# Patient Record
Sex: Male | Born: 1965 | Race: White | Hispanic: No | Marital: Single | Smoking: Never smoker
Health system: Southern US, Community
[De-identification: ages and names within clinical notes are randomized; demographics above are authoritative.]

## PROBLEM LIST (undated history)

## (undated) HISTORY — PX: APPENDECTOMY: SHX54

---

## 2010-04-29 ENCOUNTER — Encounter: Admission: RE | Admit: 2010-04-29 | Discharge: 2010-04-29 | Payer: Self-pay | Admitting: Internal Medicine

## 2011-11-14 ENCOUNTER — Ambulatory Visit: Payer: Self-pay

## 2012-03-06 IMAGING — US US ABDOMEN COMPLETE
1 series · 14 of 25 positions shown · non-contrast
Comparison: None.

CLINICAL DATA: Elevated liver function tests, hypertension

COMPLETE ABDOMINAL ULTRASOUND

[Series 1: us abdomen complete · 0.28mm/px · 14 of 59 slices shown]
[im 1/59]
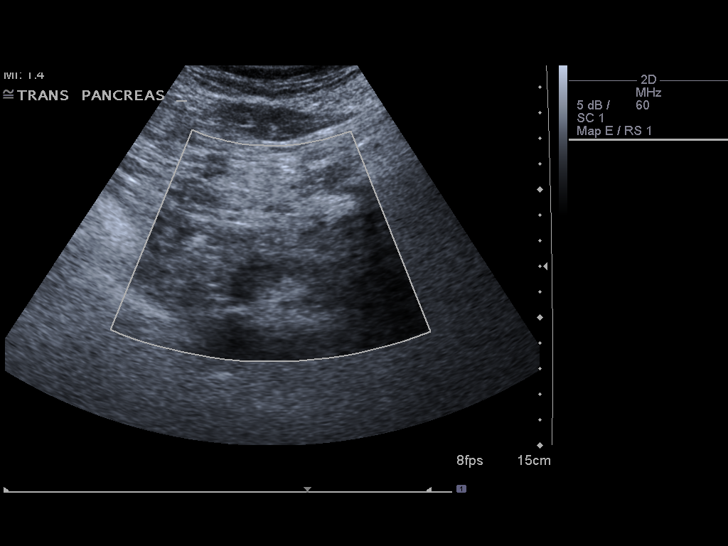
[im 5/59]
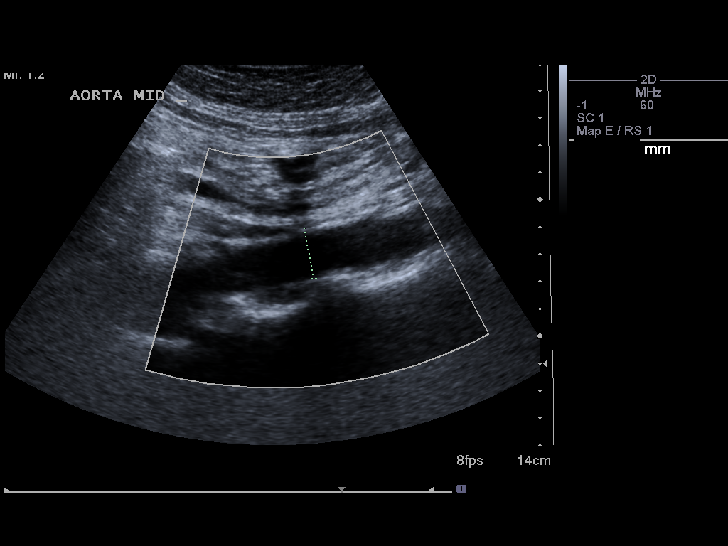
[im 10/59]
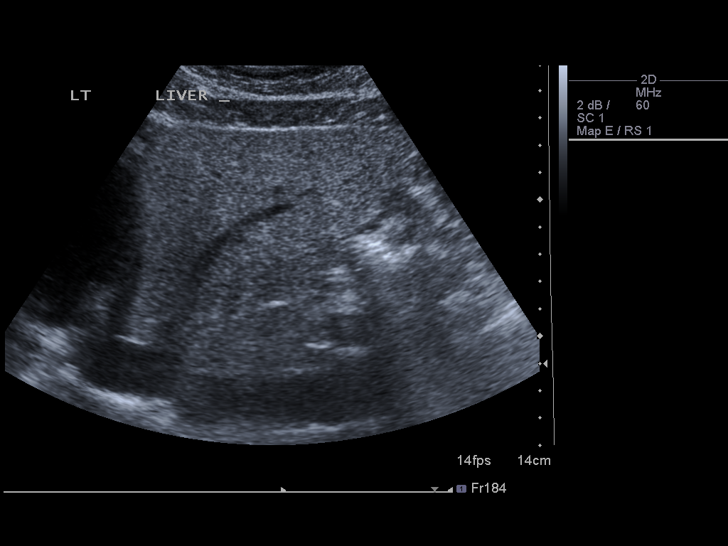
[im 15/59]
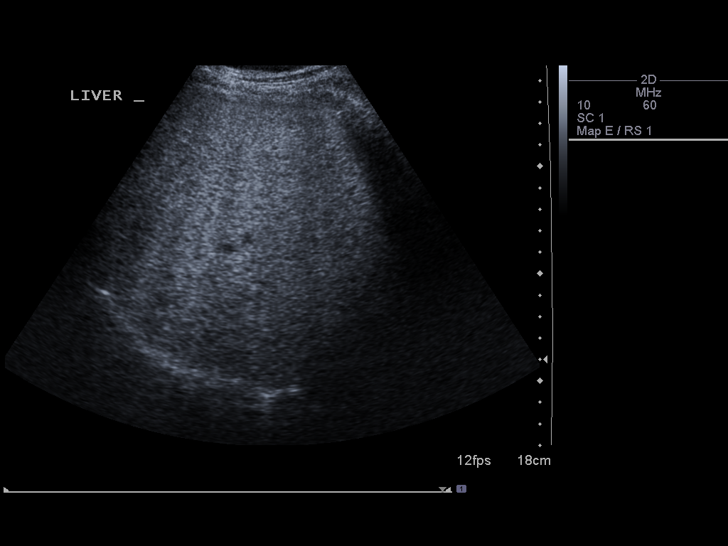
[im 20/59]
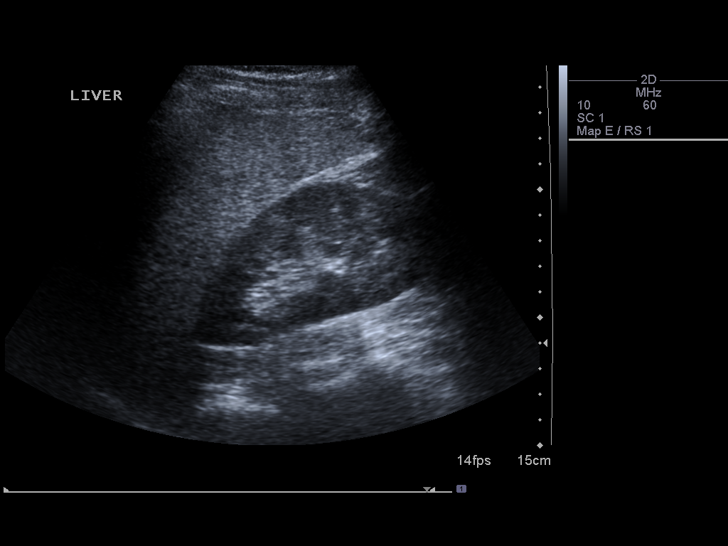
[im 22/59]
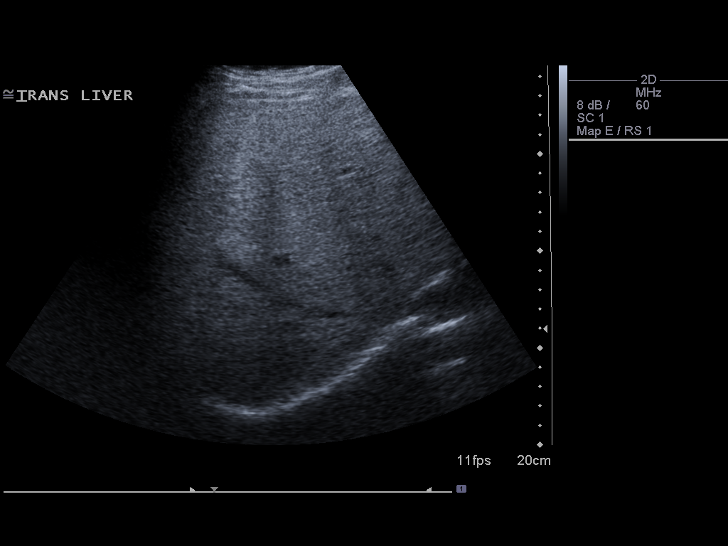
[im 27/59]
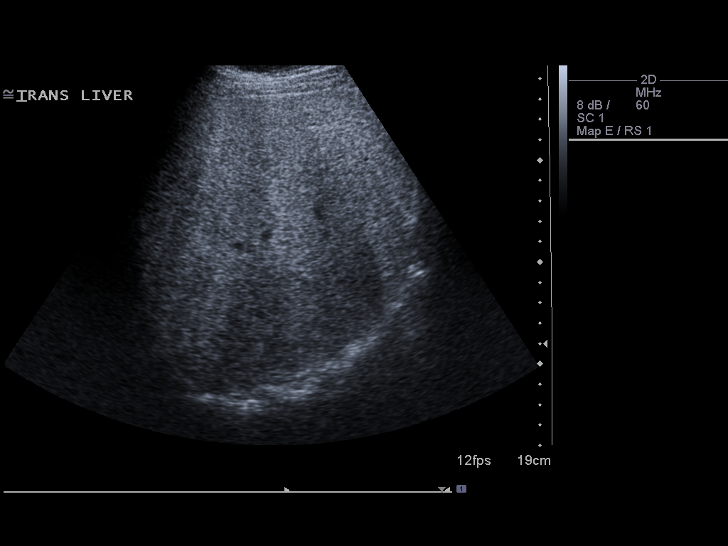
[im 32/59]
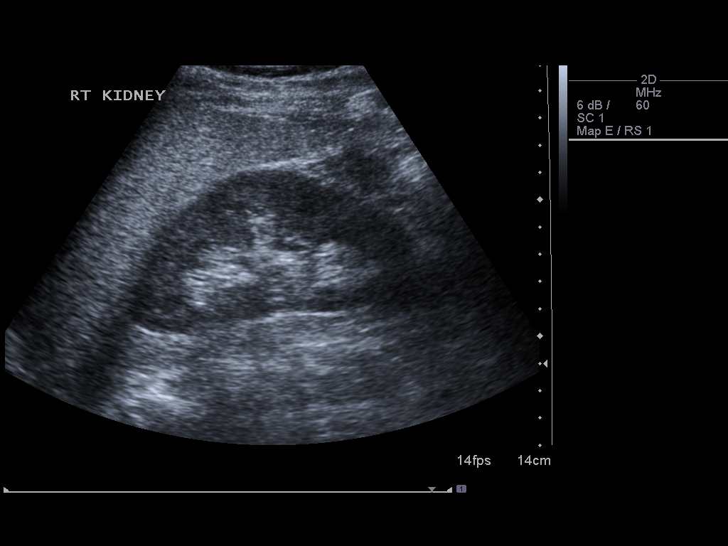
[im 37/59]
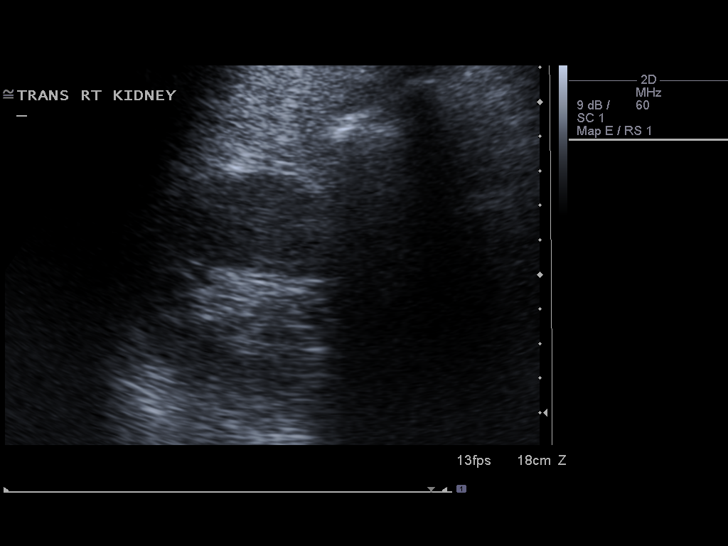
[im 39/59]
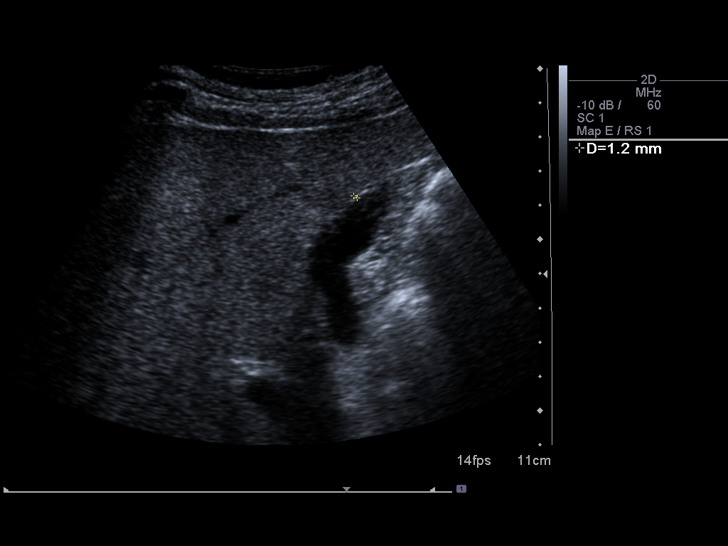
[im 44/59]
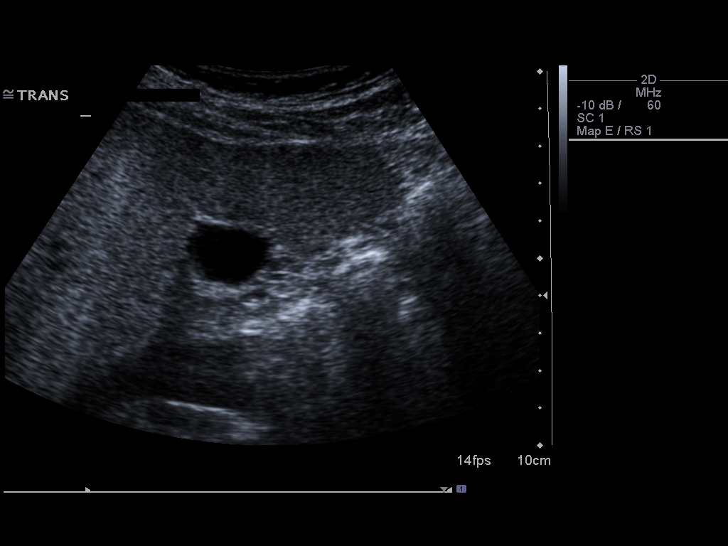
[im 49/59]
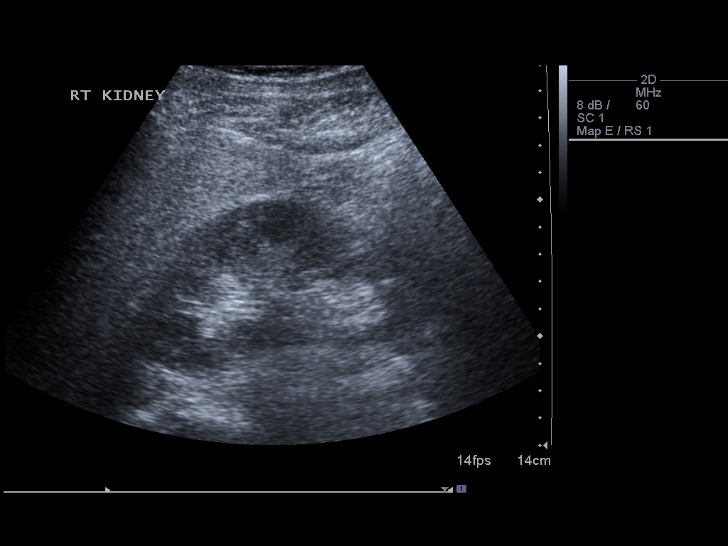
[im 54/59]
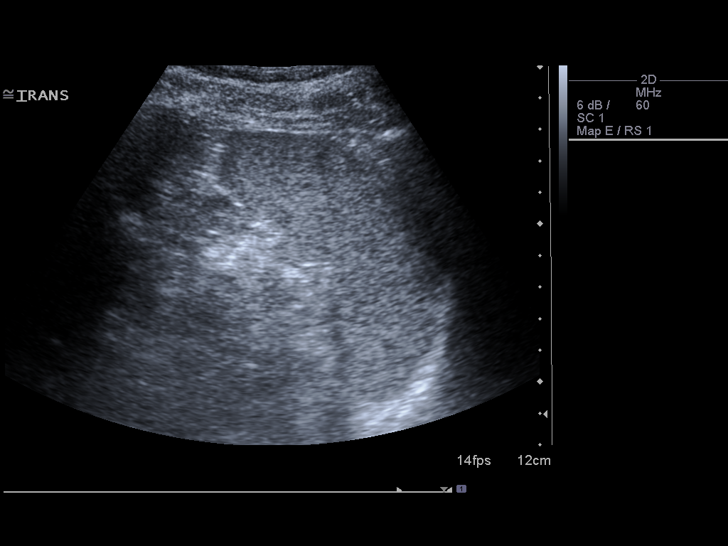
[im 59/59]
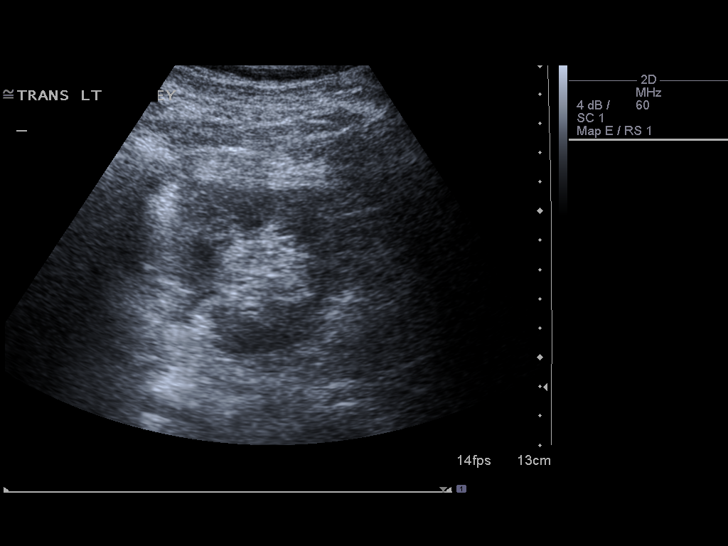

[14 of 25 positions shown; findings below may reference images not displayed]

FINDINGS: Gallbladder:  No gallstones are seen.  There is no pain over
gallbladder with compression.

Common bile duct:  The common bile duct is normal measuring 2.5 mm
in diameter.

Liver:  The liver is diffusely echogenic consistent with fatty
infiltration.  No focal abnormality is seen and no ductal
dilatation is noted.

IVC:  The IVC is obscured by bowel gas.

Pancreas:  The pancreas is obscured by bowel gas.

Spleen:  The spleen is normal measuring 6.0 cm sagittally.

Right Kidney:  No hydronephrosis.  The right kidney measures
cm sagittally.

Left Kidney:  No hydronephrosis.  The left kidney measures 12.2 cm.

Abdominal aorta:    The abdominal aorta is normal caliber.
IMPRESSION: 1.  Diffuse fatty infiltration of the liver.  No focal abnormality.
2.  No gallstones.
3.  The pancreas is obscured by bowel gas.

## 2016-12-30 ENCOUNTER — Other Ambulatory Visit: Payer: Self-pay | Admitting: Family Medicine

## 2016-12-30 DIAGNOSIS — Z789 Other specified health status: Secondary | ICD-10-CM

## 2016-12-30 DIAGNOSIS — R740 Nonspecific elevation of levels of transaminase and lactic acid dehydrogenase [LDH]: Secondary | ICD-10-CM

## 2016-12-30 DIAGNOSIS — F1090 Alcohol use, unspecified, uncomplicated: Secondary | ICD-10-CM

## 2016-12-30 DIAGNOSIS — R100 Acute abdomen: Secondary | ICD-10-CM

## 2016-12-30 DIAGNOSIS — R7401 Elevation of levels of liver transaminase levels: Secondary | ICD-10-CM

## 2016-12-30 DIAGNOSIS — R634 Abnormal weight loss: Secondary | ICD-10-CM

## 2017-01-05 ENCOUNTER — Ambulatory Visit
Admission: RE | Admit: 2017-01-05 | Discharge: 2017-01-05 | Disposition: A | Discharge status: Home / Self Care | Payer: Self-pay | Source: Ambulatory Visit | Attending: Family Medicine | Admitting: Family Medicine

## 2017-01-05 DIAGNOSIS — K829 Disease of gallbladder, unspecified: Secondary | ICD-10-CM | POA: Insufficient documentation

## 2017-01-05 DIAGNOSIS — R1013 Epigastric pain: Secondary | ICD-10-CM | POA: Insufficient documentation

## 2017-01-05 DIAGNOSIS — Z7289 Other problems related to lifestyle: Secondary | ICD-10-CM | POA: Insufficient documentation

## 2017-01-05 DIAGNOSIS — R161 Splenomegaly, not elsewhere classified: Secondary | ICD-10-CM | POA: Insufficient documentation

## 2017-01-05 DIAGNOSIS — R740 Nonspecific elevation of levels of transaminase and lactic acid dehydrogenase [LDH]: Secondary | ICD-10-CM | POA: Insufficient documentation

## 2017-01-05 DIAGNOSIS — R634 Abnormal weight loss: Secondary | ICD-10-CM | POA: Insufficient documentation

## 2017-01-05 DIAGNOSIS — R100 Acute abdomen: Secondary | ICD-10-CM

## 2017-01-05 DIAGNOSIS — R1011 Right upper quadrant pain: Secondary | ICD-10-CM | POA: Insufficient documentation

## 2017-01-05 DIAGNOSIS — Z789 Other specified health status: Secondary | ICD-10-CM

## 2017-01-05 DIAGNOSIS — F1090 Alcohol use, unspecified, uncomplicated: Secondary | ICD-10-CM

## 2017-01-05 DIAGNOSIS — R7401 Elevation of levels of liver transaminase levels: Secondary | ICD-10-CM

## 2017-06-17 ENCOUNTER — Emergency Department: Payer: Self-pay

## 2017-06-17 ENCOUNTER — Emergency Department
Admission: EM | Admit: 2017-06-17 | Discharge: 2017-06-17 | Disposition: A | Discharge status: Home / Self Care | Payer: Self-pay | Attending: Emergency Medicine | Admitting: Emergency Medicine

## 2017-06-17 DIAGNOSIS — M5442 Lumbago with sciatica, left side: Secondary | ICD-10-CM | POA: Insufficient documentation

## 2017-06-17 DIAGNOSIS — M62830 Muscle spasm of back: Secondary | ICD-10-CM | POA: Insufficient documentation

## 2017-06-17 MED ORDER — DICLOFENAC SODIUM 50 MG PO TBEC: 50.0000 mg | DELAYED_RELEASE_TABLET | Freq: Two times a day (BID) | ORAL | 0 refills | Status: DC

## 2017-06-17 MED ORDER — KETOROLAC TROMETHAMINE 60 MG/2ML IM SOLN
30.0000 mg | Freq: Once | INTRAMUSCULAR | Status: AC
  Administered 2017-06-17: 30 mg via INTRAMUSCULAR
  Filled 2017-06-17: qty 2

## 2017-06-17 MED ORDER — CYCLOBENZAPRINE HCL 5 MG PO TABS: 5.0000 mg | ORAL_TABLET | Freq: Three times a day (TID) | ORAL | 0 refills | Status: DC | PRN

## 2017-06-17 NOTE — ED Triage Notes (Signed)
pt c/o lower back pain for the past 3 weeks, worse in the past few days to the point were he is not able to sit.Omar Fox

## 2017-06-17 NOTE — ED Notes (Signed)
Patient reports lower back pain with difficulty walking x 1.5 weeks.  Patient states it was difficult to get out of bed this am.  Patient reports history of "back problems."  Patient appears uncomfortable in triage.  Patient reports movement increases back pain.  Patient reports moving tree limbs lately.

## 2017-06-17 NOTE — Discharge Instructions (Signed)
Your exam and x-ray shows some lumbar strain and muscle spasms. You have some mild degenerative changes on your x-ray. There is no indication of a herniated disc or fracture. You should take the prescription meds as directed. Continue to see your chiropractor as needed. Follow-up with [facility] for continued symptoms. Return to the ED for worsening pain, weakness, or loss of bladder/bowel control.

## 2017-06-17 NOTE — ED Provider Notes (Signed)
[facility] Emergency Department Provider Note ____________________________________________  Time seen: 1035  I have reviewed the triage vital signs and the nursing notes.  HISTORY  Chief Complaint  Back Pain  HPI Omar Fox is a 51 y.o. male presents to the ED for evaluation of a 3 week complaint of low back pain. Patient denies any recent injury, accident, trauma, or fall. He describes his pain has worsened over the last few days. He reports pain is increased with prolonged sitting. He denies any weakness, foot drop, or incontinence. He reports intermittent catching spasms, as well as some referred pain down the back of the left leg. He also notes some numbness to the medial aspect of his right great toe. He has seen his chiropractor a couple of times since onset.   History reviewed. No pertinent past medical history.  There are no active problems to display for this patient.   Past Surgical History:  Procedure Laterality Date  . APPENDECTOMY      Prior to Admission medications   Medication Sig Start Date End Date Taking? Authorizing Provider  cyclobenzaprine (FLEXERIL) 5 MG tablet Take 1 tablet (5 mg total) by mouth 3 (three) times daily as needed for muscle spasms. 06/17/17   Signe Tackitt, Charlesetta Ivory, PA-C  diclofenac (VOLTAREN) 50 MG EC tablet Take 1 tablet (50 mg total) by mouth 2 (two) times daily. 06/17/17   Capri Veals, Charlesetta Ivory, PA-C    Allergies Penicillins  No family history on file.  Social History Social History  Substance Use Topics  . Smoking status: Never Smoker  . Smokeless tobacco: Current User  . Alcohol use Yes    Review of Systems  Constitutional: Negative for fever. Cardiovascular: Negative for chest pain. Respiratory: Negative for shortness of breath. Gastrointestinal: Negative for abdominal pain, vomiting and diarrhea. Genitourinary: Negative for dysuria. Musculoskeletal: Positive for back pain. Skin: Negative for  rash. Neurological: Negative for headaches, focal weakness or numbness. ____________________________________________  PHYSICAL EXAM:  VITAL SIGNS: ED Triage Vitals  Enc Vitals Group     BP 06/17/17 1001 (!) 160/99     Pulse Rate 06/17/17 1001 99     Resp 06/17/17 1001 16     Temp 06/17/17 1001 98 F (36.7 C)     Temp Source 06/17/17 1001 Oral     SpO2 06/17/17 1001 99 %     Weight 06/17/17 1002 180 lb (81.6 kg)     Height 06/17/17 1002  (1.803 m)     Head Circumference --      Peak Flow --      Pain Score 06/17/17 1001 10     Pain Loc --      Pain Edu? --      Excl. in [place]? --     Constitutional: Alert and oriented. Well appearing and in no distress. Head: Normocephalic and atraumatic. Cardiovascular: Normal rate, regular rhythm. Normal distal pulses. Respiratory: Normal respiratory effort. No wheezes/rales/rhonchi. Gastrointestinal: Soft and nontender. No distention. Musculoskeletal: normal spinal alignment without midline tenderness, spasm, deformity, or step-off. Patient localized tenderness to the lumbar sacral junction, in the midline. He is able to demonstrate normal lumbar extension and flexion range to the knees. Nontender with normal range of motion in all extremities.  Neurologic: cranial nerves II through XII grossly intact. Normal LE DTRs bilaterally. Normal toe dorsiflexion and foot eversion. egative Trendelenburg. Negative supine straight leg raise bilaterally.Normal gait without ataxia. Normal speech and language. No gross focal neurologic deficits are appreciated.  Skin:  Skin is warm, dry and intact. No rash noted. ____________________________________________   RADIOLOGY  Lumbar Spine  IMPRESSION: Degenerative change without acute abnormality  I, Lillie Portner, Charlesetta Ivory, personally viewed and evaluated these images (plain radiographs) as part of my medical decision making, as well as reviewing the written report by the  radiologist. ____________________________________________  PROCEDURES  Toradol 30 mg IM ____________________________________________  INITIAL IMPRESSION / ASSESSMENT AND PLAN / ED COURSE  Patient with ED evaluation of a 3 week complaint of low back pain. He reports pain is improved to 7 out of 10 at the time of this disposition. He'll be discharged with a prescription for diclofenac and cyclobenzaprine. He is also referred to [facility] clinic clinic for ongoing and routine medical care. Return precautions are reviewed with the patient. ____________________________________________  FINAL CLINICAL IMPRESSION(S) / ED DIAGNOSES  Final diagnoses:  Acute midline low back pain with left-sided sciatica  Muscle spasm of back     Lissa Hoard, PA-C 06/17/17 1218    Sharman Cheek, MD 06/19/17 1545

## 2017-06-17 NOTE — ED Notes (Signed)
Provider in room to assess patient.  Will continue to monitor.   

## 2018-03-06 IMAGING — US US ABDOMEN COMPLETE
1 series · 13 of 25 positions shown · non-contrast
Comparison: Abdominal ultrasound April 29, 2010.

CLINICAL DATA: Weight loss, elevated transaminases, history of
heavy alcohol consumption.

EXAM:
ABDOMEN ULTRASOUND COMPLETE

[Series 1: us abdomen complete · 0.19mm/px · 13 of 91 slices shown]
[im 1/91]
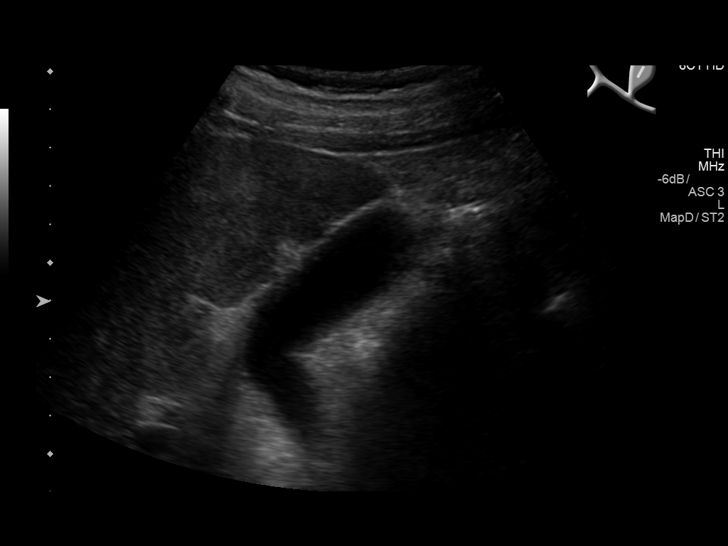
[im 8/91]
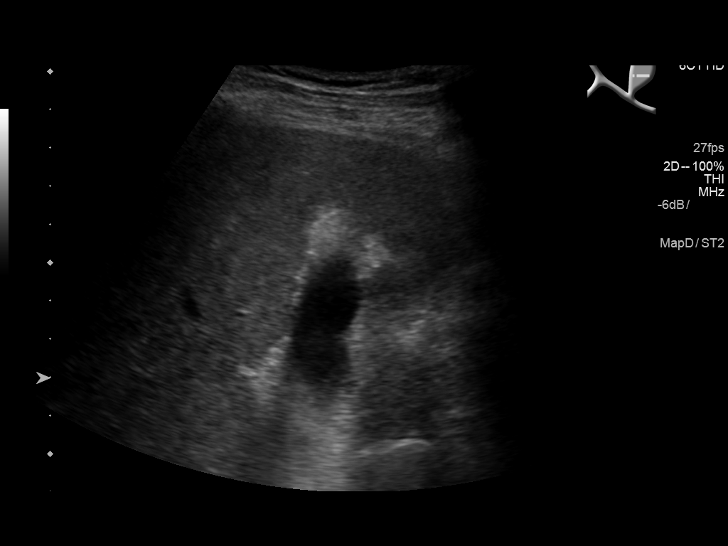
[im 16/91]
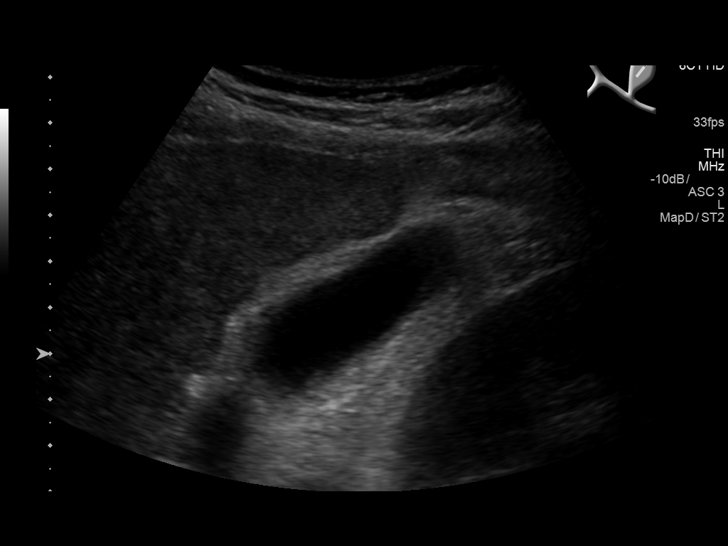
[im 23/91]
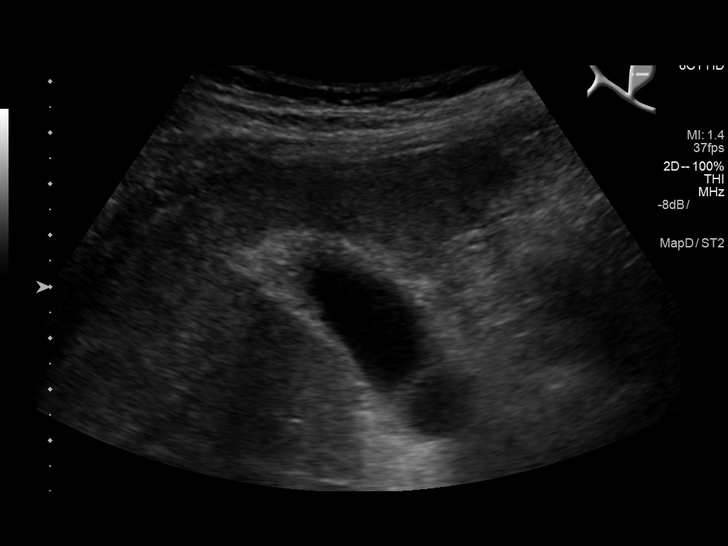
[im 31/91]
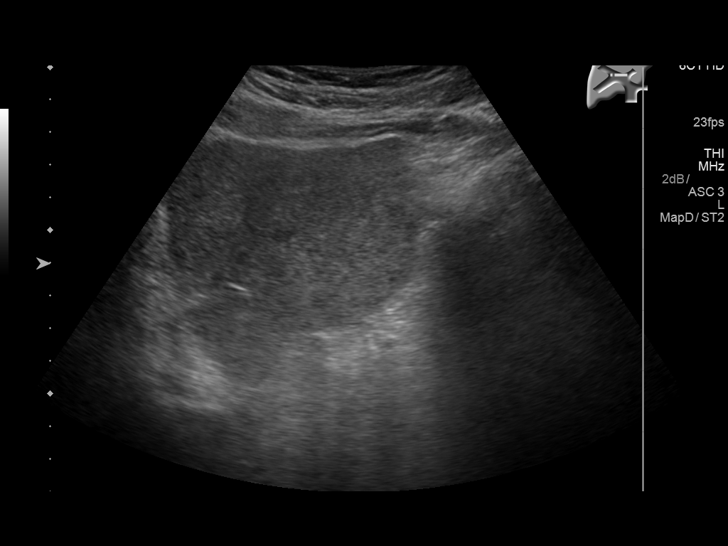
[im 38/91]
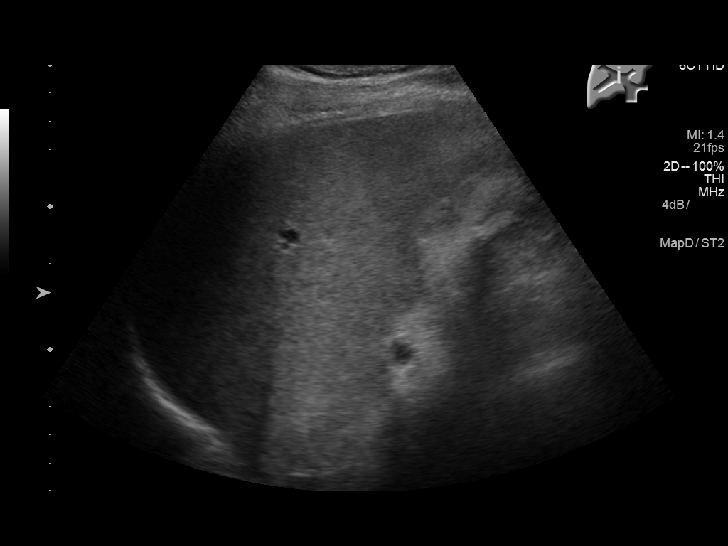
[im 46/91]
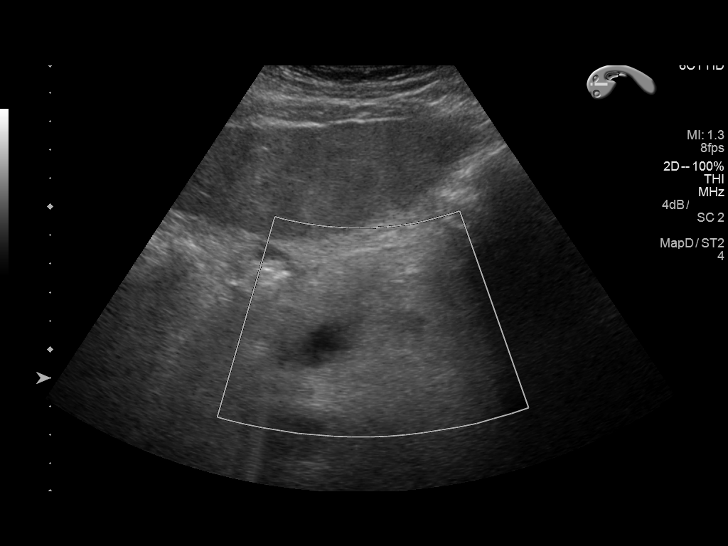
[im 53/91]
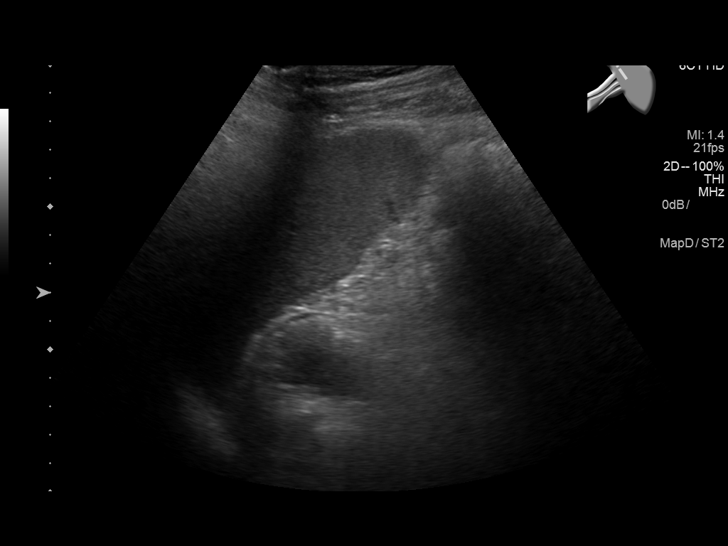
[im 61/91]
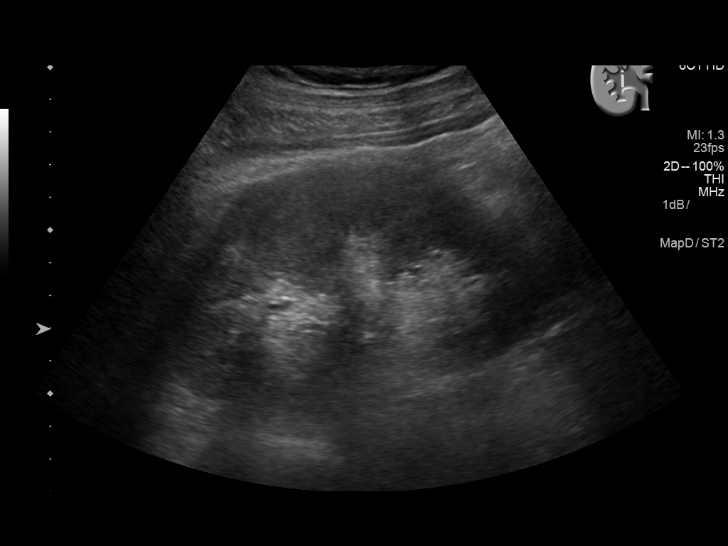
[im 68/91]
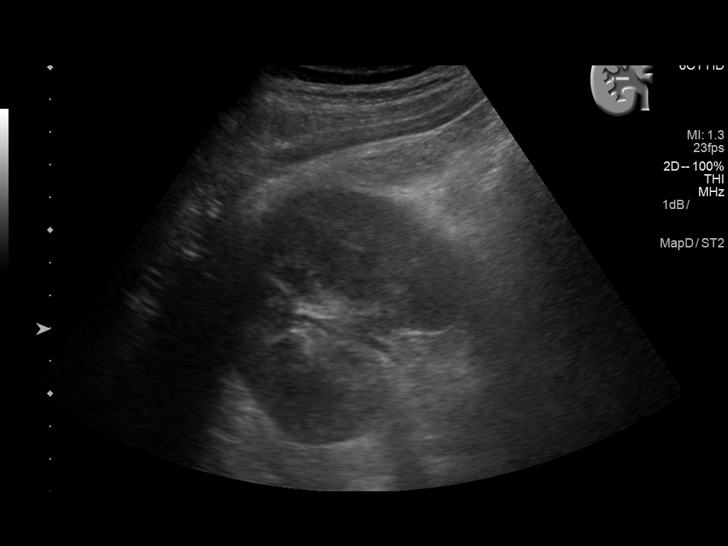
[im 76/91]
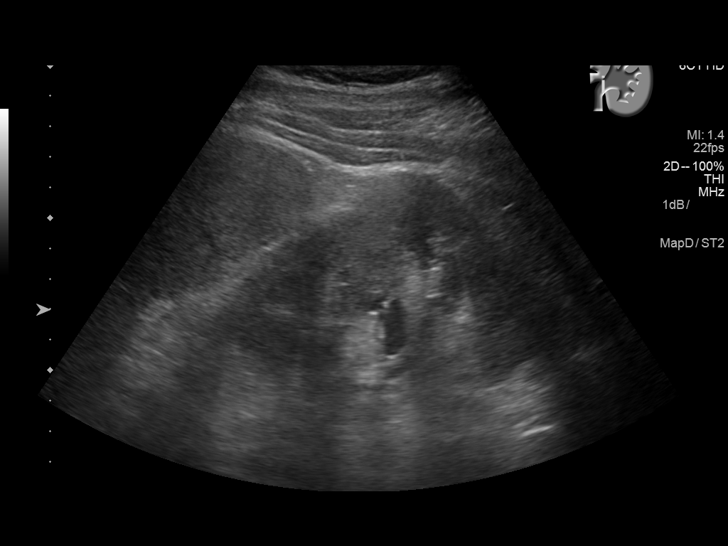
[im 83/91]
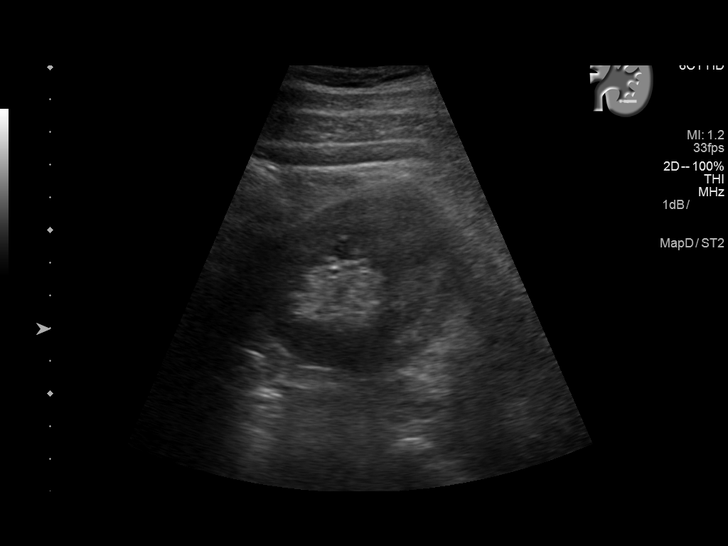
[im 91/91]
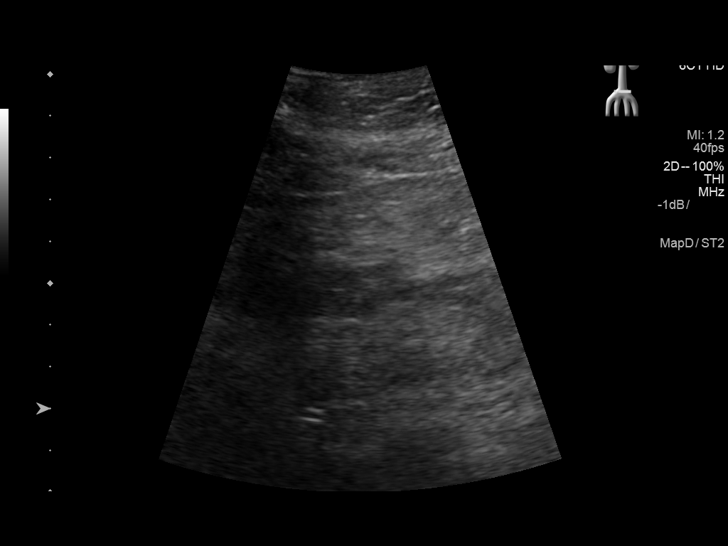

[13 of 25 positions shown; findings below may reference images not displayed]

FINDINGS: Gallbladder: The gallbladder is only partially distended. Its wall
is thickened at 7.2 mm. There is no pericholecystic fluid or
positive sonographic Murphy's sign. No stones or sludge are
observed.

Common bile duct: Diameter: 3.5 mm

Liver: The hepatic echotexture is heterogeneous. The surface contour
is mildly nodular. There is no focal mass nor ductal dilation.

IVC: No abnormality visualized.

Pancreas: The pancreatic head and body are normal. The pancreatic
tail is obscured by bowel gas.

Spleen: The spleen is mildly enlarged and exhibits normal
echotexture. The splenic volume is 419 cc. The spleen measures 9.4 x
13.2 x 6.4 cm.

Right Kidney: Length: 11.8 cm. Echogenicity within normal limits. No
mass or hydronephrosis visualized.

Left Kidney: Length: 11.8 cm. Echogenicity within normal limits. No
mass or hydronephrosis visualized.

Abdominal aorta: No aneurysm visualized.

Other findings: There is no ascites.
IMPRESSION: Gallbladder wall thickening without evidence of stones or positive
sonographic Murphy's sign. If chronic gallbladder dysfunction is
suspected clinically, a nuclear medicine hepatobiliary scan with
gallbladder ejection fraction determination may be useful.

Heterogeneous hepatic echotexture with surface contour nodularity
may reflect early cirrhotic change. No suspicious masses are
observed.

Mild splenomegaly.

## 2019-04-02 ENCOUNTER — Ambulatory Visit
Admission: EM | Admit: 2019-04-02 | Discharge: 2019-04-02 | Disposition: A | Discharge status: Home / Self Care | Payer: Self-pay | Attending: Family Medicine | Admitting: Family Medicine

## 2019-04-02 ENCOUNTER — Other Ambulatory Visit: Payer: Self-pay

## 2019-04-02 DIAGNOSIS — W268XXA Contact with other sharp object(s), not elsewhere classified, initial encounter: Secondary | ICD-10-CM

## 2019-04-02 DIAGNOSIS — Z23 Encounter for immunization: Secondary | ICD-10-CM

## 2019-04-02 DIAGNOSIS — L089 Local infection of the skin and subcutaneous tissue, unspecified: Secondary | ICD-10-CM

## 2019-04-02 DIAGNOSIS — S91331A Puncture wound without foreign body, right foot, initial encounter: Secondary | ICD-10-CM

## 2019-04-02 DIAGNOSIS — T07XXXA Unspecified multiple injuries, initial encounter: Secondary | ICD-10-CM

## 2019-04-02 MED ORDER — SULFAMETHOXAZOLE-TRIMETHOPRIM 800-160 MG PO TABS: 1.0000 | ORAL_TABLET | Freq: Two times a day (BID) | ORAL | 0 refills | Status: AC

## 2019-04-02 MED ORDER — MUPIROCIN 2 % EX OINT: TOPICAL_OINTMENT | CUTANEOUS | 0 refills | Status: AC

## 2019-04-02 MED ORDER — TETANUS-DIPHTH-ACELL PERTUSSIS 5-2.5-18.5 LF-MCG/0.5 IM SUSP
0.5000 mL | Freq: Once | INTRAMUSCULAR | Status: AC
  Administered 2019-04-02: 12:00:00 0.5 mL via INTRAMUSCULAR

## 2019-04-02 MED ORDER — CIPROFLOXACIN HCL 500 MG PO TABS: 500.0000 mg | ORAL_TABLET | Freq: Two times a day (BID) | ORAL | 0 refills | Status: AC

## 2019-04-02 NOTE — ED Triage Notes (Signed)
Patient states that on Wednesday he was at the beach and hopped out of the boat on to some oyster beds and states that they tore his feet up. Patient with multiple areas of abrasions and bruising.

## 2019-04-02 NOTE — ED Provider Notes (Signed)
[facility] ____________________________________________  Time seen: Approximately 12:28 PM  I have reviewed the triage vital signs and the nursing notes.   HISTORY  Chief Complaint Foot Pain   HPI Omar Fox is a 53 y.o. male presenting for evaluation of bilateral plantar aspect of feet abrasions and wounds.  Patient reports this past Wednesday while he was at the beach he got off of his Jet[employer]ki and stepped onto an oyster bed that caused abrasions to his feet.  States his feet initially were okay, but has had gradual onset of soreness.  Did clean on Wednesday but otherwise not really.  Return from the beach last night.  Denies any bony pain or difficulty weightbearing.  States area feels sore and a little bit of swelling to his left foot.  Denies drainage.  Denies pain radiation or paresthesias.  Denies decreased range of motion.  Denies foreign bodies to feet.  Denies cough, congestion, fever, chest pain, shortness of breath or recent sickness.  Reports otherwise doing well.  Penicillin allergy with anaphylaxis.  Unsure of last tetanus immunization.   History reviewed. No pertinent past medical history.  There are no active problems to display for this patient.   Past Surgical History:  Procedure Laterality Date  . APPENDECTOMY       No current facility-administered medications for this encounter.   Current Outpatient Medications:  .  spironolactone (ALDACTONE) 25 MG tablet, Take by mouth., Disp: , Rfl:  .  ciprofloxacin (CIPRO) 500 MG tablet, Take 1 tablet (500 mg total) by mouth every 12 (twelve) hours for 3 days., Disp: 6 tablet, Rfl: 0 .  mupirocin ointment (BACTROBAN) 2 %, Apply two times a day for 7 days., Disp: 22 g, Rfl: 0 .  sulfamethoxazole-trimethoprim (BACTRIM DS) 800-160 MG tablet, Take 1 tablet by mouth 2 (two) times daily for 10 days., Disp: 20 tablet, Rfl: 0  Allergies Penicillins  History reviewed. No pertinent family history.  Social  History Social History   Tobacco Use  . Smoking status: Never Smoker  . Smokeless tobacco: Current User  Substance Use Topics  . Alcohol use: Yes  . Drug use: No    Review of Systems Constitutional: No fever ENT: No sore throat. Cardiovascular: Denies chest pain. Respiratory: Denies shortness of breath. Gastrointestinal: No abdominal pain.  Musculoskeletal: Negative for back pain. Skin: Positive skin wound.  ____________________________________________   PHYSICAL EXAM:  VITAL SIGNS: ED Triage Vitals  Enc Vitals Group     BP 04/02/19 1136 116/74     Pulse Rate 04/02/19 1136 68     Resp 04/02/19 1136 18     Temp 04/02/19 1136 97.8 F (36.6 C)     Temp Source 04/02/19 1136 Oral     SpO2 04/02/19 1136 100 %     Weight 04/02/19 1134 175 lb (79.4 kg)     Height 04/02/19 1134 5\' 11"  (1.803 m)     Head Circumference --      Peak Flow --      Pain Score 04/02/19 1134 4     Pain Loc --      Pain Edu? --      Excl. in [place]? --     Constitutional: Alert and oriented. Well appearing and in no acute distress. ENT      Head: Normocephalic and atraumatic. Cardiovascular: Normal rate, regular rhythm. Grossly normal heart sounds.  Good peripheral circulation. Respiratory: Normal respiratory effort without tachypnea nor retractions. Breath sounds are clear and equal bilaterally. No  wheezes, rales, rhonchi. Musculoskeletal: Steady gait.  Bilateral distal pedal pulses equal and easily palpated. Neurologic:  Normal speech and language. Speech is normal. No gait instability.  Skin:  Skin is warm, dry. Except: Bilateral feet plantar aspect multiple superficial abrasions, no palpable foreign body, no visible foreign body, minimal tenderness to few of left foot abrasions, no bony tenderness, full range of motion present, normal distal sensation and capillary refill to both feet. Psychiatric: Mood and affect are normal. Speech and behavior are normal. Patient exhibits appropriate insight and  judgment   ___________________________________________   LABS (all labs ordered are listed, but only abnormal results are displayed)  Labs Reviewed - No data to display ____________________________________________   PROCEDURES Procedures   Bilateral feet cleaned and soaked and irrigated by nurse.  INITIAL IMPRESSION / ASSESSMENT AND PLAN / ED COURSE  Pertinent labs & imaging results that were available during my care of the patient were reviewed by me and considered in my medical decision making (see chart for details).  Well-appearing patient.  No acute distress.  Bilateral feet with superficial appearing abrasions from stepping on shells at the beach.  No appearance of retained foreign body.  Patient denies foreign body as well as denies bony pain, declines x-ray.  Wounds cleaned in urgent care.  Tetanus immunization updated.  Concern for onset of secondary cellulitis to left foot.  Will treat with 3-day course of Cipro and 7-day course of Bactrim and topical Bactroban.  Patient penicillin anaphylactic.  Warm soapy water soaks, elevation, keeping clean and monitoring.Discussed indication, risks and benefits of medications with patient.  Discussed follow up with Primary care physician this week. Discussed follow up and return parameters including no resolution or any worsening concerns. Patient verbalized understanding and agreed to plan.   ____________________________________________   FINAL CLINICAL IMPRESSION(S) / ED DIAGNOSES  Final diagnoses:  Multiple abrasions  Infected puncture wound of plantar aspect of foot, unspecified laterality, initial encounter     ED Discharge Orders         Ordered    sulfamethoxazole-trimethoprim (BACTRIM DS) 800-160 MG tablet  2 times daily     04/02/19 1215    ciprofloxacin (CIPRO) 500 MG tablet  Every 12 hours     04/02/19 1215    mupirocin ointment (BACTROBAN) 2 %     04/02/19 1215           Note: This dictation was prepared  with Dragon dictation along with smaller phrase technology. Any transcriptional errors that result from this process are unintentional.         Renford DillsMiller, Justyne Roell, NP 04/02/19 1356

## 2019-04-02 NOTE — Discharge Instructions (Addendum)
Take medication as prescribed. Rest. Drink plenty of fluids. Elevate. Warm soapy water soaks. Keep clean. Monitor.   Follow up with your primary care physician this week as needed. Return to Urgent care for new or worsening concerns.

## 2019-08-30 DEATH — deceased
